# Patient Record
Sex: Male | Born: 1985 | Marital: Married | Smoking: Never smoker
Health system: Southern US, Community
[De-identification: ages and names within clinical notes are randomized; demographics above are authoritative.]

---

## 2014-02-08 ENCOUNTER — Ambulatory Visit (INDEPENDENT_AMBULATORY_CARE_PROVIDER_SITE_OTHER): Payer: BC Managed Care – PPO | Admitting: Sports Medicine

## 2014-02-08 ENCOUNTER — Encounter: Payer: Self-pay | Admitting: Sports Medicine

## 2014-02-08 VITALS — BP 104/67 | HR 69 | Ht 70.0 in | Wt 149.0 lb

## 2014-02-08 DIAGNOSIS — M940 Chondrocostal junction syndrome [Tietze]: Secondary | ICD-10-CM

## 2014-02-08 DIAGNOSIS — R0789 Other chest pain: Secondary | ICD-10-CM | POA: Insufficient documentation

## 2014-02-08 DIAGNOSIS — Z0000 Encounter for general adult medical examination without abnormal findings: Secondary | ICD-10-CM

## 2014-02-08 NOTE — Patient Instructions (Signed)
Costochondritis Costochondritis, sometimes called Tietze syndrome, is a swelling and irritation (inflammation) of the tissue (cartilage) that connects your ribs with your breastbone (sternum). It causes pain in the chest and rib area. Costochondritis usually goes away on its own over time. It can take up to 6 weeks or longer to get better, especially if you are unable to limit your activities. CAUSES  Some cases of costochondritis have no known cause. Possible causes include:  Injury (trauma).  Exercise or activity such as lifting.  Severe coughing. SIGNS AND SYMPTOMS  Pain and tenderness in the chest and rib area.  Pain that gets worse when coughing or taking deep breaths.  Pain that gets worse with specific movements. DIAGNOSIS  Your health care provider will do a physical exam and ask about your symptoms. Chest X-rays or other tests may be done to rule out other problems. TREATMENT  Costochondritis usually goes away on its own over time. Your health care provider may prescribe medicine to help relieve pain. HOME CARE INSTRUCTIONS   Avoid exhausting physical activity. Try not to strain your ribs during normal activity. This would include any activities using chest, abdominal, and side muscles, especially if heavy weights are used.  Apply ice to the affected area for the first 2 days after the pain begins.  Put ice in a plastic bag.  Place a towel between your skin and the bag.  Leave the ice on for 20 minutes, 2-3 times a day.  Only take over-the-counter or prescription medicines as directed by your health care provider. SEEK MEDICAL CARE IF:  You have redness or swelling at the rib joints. These are signs of infection.  Your pain does not go away despite rest or medicine. SEEK IMMEDIATE MEDICAL CARE IF:   Your pain increases or you are very uncomfortable.  You have shortness of breath or difficulty breathing.  You cough up blood.  You have worse chest pains,  sweating, or vomiting.  You have a fever or persistent symptoms for more than 2-3 days.  You have a fever and your symptoms suddenly get worse. MAKE SURE YOU:   Understand these instructions.  Will watch your condition.  Will get help right away if you are not doing well or get worse. Document Released: 03/19/2005 Document Revised: 03/30/2013 Document Reviewed: 01/11/2013 [employer] Patient Information 2015 [employer]. This information is not intended to replace advice given to you by your health care provider. Make sure you discuss any questions you have with your health care provider.  

## 2014-02-08 NOTE — Assessment & Plan Note (Signed)
Routine blood work. 

## 2014-02-08 NOTE — Progress Notes (Signed)
  Subjective:    CC: Establish care.   HPI:  This is a very pleasant 28 year old male, he is healthy, unfortunately after doing some rows in the gym he had immediate pain and a popping sensation in his left parasternal region. Since then he has not had any shortness of breath and symptoms have been progressively improving. Pain is mild, improving, no radiation, no lower extremity swelling. Pain is nonexertional and non-pleuritic.  Past medical history, Surgical history, Family history not pertinant except as noted below, Social history, Allergies, and medications have been entered into the medical record, reviewed, and no changes needed.   Review of Systems: No headache, visual changes, nausea, vomiting, diarrhea, constipation, dizziness, abdominal pain, skin rash, fevers, chills, night sweats, swollen lymph nodes, weight loss, chest pain, body aches, joint swelling, muscle aches, shortness of breath, mood changes, visual or auditory hallucinations.  Objective:    General: Well Developed, well nourished, and in no acute distress.  Neuro: Alert and oriented x3, extra-ocular muscles intact, sensation grossly intact.  HEENT: Normocephalic, atraumatic, pupils equal round reactive to light, neck supple, no masses, no lymphadenopathy, thyroid nonpalpable.  Skin: Warm and dry, no rashes noted.  Cardiac: Regular rate and rhythm, no murmurs rubs or gallops.  Respiratory: Clear to auscultation bilaterally. Not using accessory muscles, speaking in full sentences. No tenderness to palpation or deformity or other abnormality noted on palpation of the entire anterior chest wall. Abdominal: Soft, nontender, nondistended, positive bowel sounds, no masses, no organomegaly.  Musculoskeletal: Shoulder, elbow, wrist, hip, knee, ankle stable, and with full range of motion.  Impression and Recommendations:    The patient was counselled, risk factors were discussed, anticipatory guidance given.

## 2014-02-08 NOTE — Assessment & Plan Note (Signed)
Starting Mobic. Return in a month.

## 2014-02-28 ENCOUNTER — Ambulatory Visit (INDEPENDENT_AMBULATORY_CARE_PROVIDER_SITE_OTHER): Payer: BC Managed Care – PPO | Admitting: Sports Medicine

## 2014-02-28 ENCOUNTER — Telehealth: Payer: Self-pay | Admitting: *Deleted

## 2014-02-28 ENCOUNTER — Ambulatory Visit (INDEPENDENT_AMBULATORY_CARE_PROVIDER_SITE_OTHER): Payer: BC Managed Care – PPO

## 2014-02-28 DIAGNOSIS — M940 Chondrocostal junction syndrome [Tietze]: Secondary | ICD-10-CM | POA: Diagnosis not present

## 2014-02-28 DIAGNOSIS — R079 Chest pain, unspecified: Secondary | ICD-10-CM

## 2014-02-28 NOTE — Telephone Encounter (Signed)
No prior auth required for CT chest w/o due to his [place]. Spoke with [facility]. Margette Fast, CMA

## 2014-02-28 NOTE — Assessment & Plan Note (Addendum)
Persistent symptoms with an occasional pop in the anterior chest associated with mild transient shortness of breath when lifting and doing overhead activities. Chest x-ray and CT scan of the chest without IV contrast. 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

## 2014-02-28 NOTE — Progress Notes (Signed)
  Subjective:    CC: Followup chest injury  HPI: This is a very pleasant 28 year old male, he was in the gym, doing overhead press and felt a pop in his anterior chest with subsequent mild pain. Exam at the last visit 3 weeks ago was essentially benign, so we treated him conservatively with Mobic, unfortunately he continues to have a popping sensation in the anterior chest in the midline beneath the sternum with overhead press. Initially he had an episode of shortness of breath but since then it is simply been a non-painful pop. Mild, persistent.  Past medical history, Surgical history, Family history not pertinant except as noted below, Social history, Allergies, and medications have been entered into the medical record, reviewed, and no changes needed.   Review of Systems: No fevers, chills, night sweats, weight loss, chest pain, or shortness of breath.   Objective:    General: Well Developed, well nourished, and in no acute distress.  Neuro: Alert and oriented x3, extra-ocular muscles intact, sensation grossly intact.  HEENT: Normocephalic, atraumatic, pupils equal round reactive to light, neck supple, no masses, no lymphadenopathy, thyroid nonpalpable.  Skin: Warm and dry, no rashes. Cardiac: Regular rate and rhythm, no murmurs rubs or gallops, no lower extremity edema.  Respiratory: Clear to auscultation bilaterally. Not using accessory muscles, speaking in full sentences. There is no tenderness to palpation at any part of the chest, sternum, manubrium, ribs, costal cartilage.  Impression and Recommendations:

## 2014-04-06 ENCOUNTER — Ambulatory Visit (INDEPENDENT_AMBULATORY_CARE_PROVIDER_SITE_OTHER): Payer: BC Managed Care – PPO

## 2014-04-06 ENCOUNTER — Ambulatory Visit (INDEPENDENT_AMBULATORY_CARE_PROVIDER_SITE_OTHER): Payer: BC Managed Care – PPO | Admitting: Sports Medicine

## 2014-04-06 ENCOUNTER — Encounter: Payer: Self-pay | Admitting: Sports Medicine

## 2014-04-06 ENCOUNTER — Telehealth: Payer: Self-pay | Admitting: *Deleted

## 2014-04-06 VITALS — BP 110/66 | HR 76 | Ht 70.0 in | Wt 152.0 lb

## 2014-04-06 DIAGNOSIS — K921 Melena: Secondary | ICD-10-CM | POA: Diagnosis not present

## 2014-04-06 DIAGNOSIS — D1809 Hemangioma of other sites: Secondary | ICD-10-CM

## 2014-04-06 LAB — HEMOCCULT GUIAC POC 1CARD (OFFICE): Fecal Occult Blood, POC: NEGATIVE

## 2014-04-06 MED ORDER — SENNOSIDES-DOCUSATE SODIUM 8.6-50 MG PO TABS: 2.0000 | ORAL_TABLET | Freq: Two times a day (BID) | ORAL | Status: DC

## 2014-04-06 MED ORDER — IOHEXOL 300 MG/ML  SOLN
100.0000 mL | Freq: Once | INTRAMUSCULAR | Status: AC | PRN
  Administered 2014-04-06: 100 mL via INTRAVENOUS

## 2014-04-06 MED ORDER — POLYETHYLENE GLYCOL 3350 17 G PO PACK: 17.0000 g | PACK | Freq: Two times a day (BID) | ORAL | Status: DC

## 2014-04-06 MED ORDER — CIPROFLOXACIN HCL 750 MG PO TABS: 750.0000 mg | ORAL_TABLET | Freq: Two times a day (BID) | ORAL | Status: DC

## 2014-04-06 MED ORDER — NITROGLYCERIN 0.4 % RE OINT: 1.0000 [in_us] | TOPICAL_OINTMENT | Freq: Two times a day (BID) | RECTAL | Status: DC | PRN

## 2014-04-06 MED ORDER — METRONIDAZOLE 500 MG PO TABS: 500.0000 mg | ORAL_TABLET | Freq: Two times a day (BID) | ORAL | Status: AC

## 2014-04-06 NOTE — Telephone Encounter (Signed)
[employer] [place] - no prior auth required for Ct abd/pelvis. Margette Fast, CMA

## 2014-04-06 NOTE — Assessment & Plan Note (Addendum)
Hemoccult was negative. Anoscopy did show fecal impaction, and anal fissure and an external hemorrhoid. There is also partial drainage of pus pocket at the 12:00 position. CT of the abdomen and pelvis with IV contrast, Cipro, Flagyl, stool regimen. Nitroglycerin ointment. Return in one week to see how things are going.

## 2014-04-06 NOTE — Progress Notes (Signed)
  Subjective:    CC: Hematochezia  HPI: This is a very pleasant 28 year old male, for the past 2 weeks she's had increasing abdominal pain, with nausea, vomiting, occasional diarrhea followed by fecal impaction. Subsequently he noted bright red blood in his stool with significant pain on stooling. No fevers, chills, night sweats, weight loss. Symptoms are mild, persistent.  Past medical history, Surgical history, Family history not pertinant except as noted below, Social history, Allergies, and medications have been entered into the medical record, reviewed, and no changes needed.   Review of Systems: No fevers, chills, night sweats, weight loss, chest pain, or shortness of breath.   Objective:    General: Well Developed, well nourished, and in no acute distress.  Neuro: Alert and oriented x3, extra-ocular muscles intact, sensation grossly intact.  HEENT: Normocephalic, atraumatic, pupils equal round reactive to light, neck supple, no masses, no lymphadenopathy, thyroid nonpalpable.  Skin: Warm and dry, no rashes. Cardiac: Regular rate and rhythm, no murmurs rubs or gallops, no lower extremity edema.  Respiratory: Clear to auscultation bilaterally. Not using accessory muscles, speaking in full sentences. Abdomen: Soft, nontender, nondistended, normal bowel sounds, no palpable masses. Anoscopy: There is a visible fecal impaction, there is also Anal fissures as well as a perirectal abscess with leaking pus. He also had a small, nonthrombosed external hemorrhoid.  Hemoccult negative.  Impression and Recommendations:

## 2014-04-06 NOTE — Patient Instructions (Signed)
Peri-Rectal Abscess Your caregiver has diagnosed you as having a peri-rectal abscess. This is an infected area near the rectum that is filled with pus. If the abscess is near the surface of the skin, your caregiver may open (incise) the area and drain the pus. HOME CARE INSTRUCTIONS   If your abscess was opened up and drained. A small piece of gauze may be placed in the opening so that it can drain. Do not remove the gauze unless directed by your caregiver.  A loose dressing may be placed over the abscess site. Change the dressing as often as necessary to keep it clean and dry.  After the drain is removed, the area may be washed with a gentle antiseptic (soap) four times per day.  A warm sitz bath, warm packs or heating pad may be used for pain relief, taking care not to burn yourself.  Return for a wound check in 1 day or as directed.  An "inflatable doughnut" may be used for sitting with added comfort. These can be purchased at a drugstore or medical supply house.  To reduce pain and straining with bowel movements, eat a high fiber diet with plenty of fruits and vegetables. Use stool softeners as recommended by your caregiver. This is especially important if narcotic type pain medications were prescribed as these may cause marked constipation.  Only take over-the-counter or prescription medicines for pain, discomfort, or fever as directed by your caregiver. SEEK IMMEDIATE MEDICAL CARE IF:   You have increasing pain that is not controlled by medication.  There is increased inflammation (redness), swelling, bleeding, or drainage from the area.  An oral temperature above 102 F (38.9 C) develops.  You develop chills or generalized malaise (feel lethargic or feel "washed out").  You develop any new symptoms (problems) you feel may be related to your present problem. Document Released: 06/06/2000 Document Revised: 09/01/2011 Document Reviewed: 06/06/2008 [facility] Patient Information  2015 [place], [place]. This information is not intended to replace advice given to you by your health care provider. Make sure you discuss any questions you have with your health care provider.   Anal Fissure, Adult An anal fissure is a small tear or crack in the skin around the anus. Bleeding from a fissure usually stops on its own within a few minutes. However, bleeding will often reoccur with each bowel movement until the crack heals.  CAUSES   Passing large, hard stools.  Frequent diarrheal stools.  Constipation.  Inflammatory bowel disease (Crohn's disease or ulcerative colitis).  Infections.  Anal sex. SYMPTOMS   Small amounts of blood seen on your stools, on toilet paper, or in the toilet after a bowel movement.  Rectal bleeding.  Painful bowel movements.  Itching or irritation around the anus. DIAGNOSIS Your caregiver will examine the anal area. An anal fissure can usually be seen with careful inspection. A rectal exam may be performed and a short tube (anoscope) may be used to examine the anal canal. TREATMENT   You may be instructed to take fiber supplements. These supplements can soften your stool to help make bowel movements easier.  Sitz baths may be recommended to help heal the tear. Do not use soap in the sitz baths.  A medicated cream or ointment may be prescribed to lessen discomfort. HOME CARE INSTRUCTIONS   Maintain a diet high in fruits, whole grains, and vegetables. Avoid constipating foods like bananas and dairy products.  Take sitz baths as directed by your caregiver.  Drink enough fluids to  keep your urine clear or pale yellow.  Only take over-the-counter or prescription medicines for pain, discomfort, or fever as directed by your caregiver. Do not take aspirin as this may increase bleeding.  Do not use ointments containing numbing medications (anesthetics) or hydrocortisone. They could slow healing. SEEK MEDICAL CARE IF:   Your fissure is not  completely healed within 3 days.  You have further bleeding.  You have a fever.  You have diarrhea mixed with blood.  You have pain.  Your problem is getting worse rather than better. MAKE SURE YOU:   Understand these instructions.  Will watch your condition.  Will get help right away if you are not doing well or get worse. Document Released: 06/09/2005 Document Revised: 09/01/2011 Document Reviewed: 11/24/2010 [facility] Patient Information 2015 [place], [place]. This information is not intended to replace advice given to you by your health care provider. Make sure you discuss any questions you have with your health care provider.

## 2014-05-04 ENCOUNTER — Encounter: Payer: Self-pay | Admitting: Sports Medicine

## 2014-05-04 ENCOUNTER — Ambulatory Visit (INDEPENDENT_AMBULATORY_CARE_PROVIDER_SITE_OTHER): Payer: BC Managed Care – PPO | Admitting: Sports Medicine

## 2014-05-04 VITALS — BP 116/71 | HR 79 | Ht 70.0 in | Wt 155.0 lb

## 2014-05-04 DIAGNOSIS — Z0000 Encounter for general adult medical examination without abnormal findings: Secondary | ICD-10-CM

## 2014-05-04 DIAGNOSIS — K921 Melena: Secondary | ICD-10-CM | POA: Diagnosis not present

## 2014-05-04 NOTE — Assessment & Plan Note (Signed)
Declines tetanus vaccination, climbs influenza vaccination.

## 2014-05-04 NOTE — Progress Notes (Signed)
  Subjective:    CC: follow-up  HPI: Anal abscess/ hematochezia: Resolved with stool regimen, antibiotics. CT scan was negative. Overall feeling significantly better. Wonders if he can start exercising again.  Preventative measures: Has not yet gotten his routine blood work, declines influenza and tetanus vaccination.  Past medical history, Surgical history, Family history not pertinant except as noted below, Social history, Allergies, and medications have been entered into the medical record, reviewed, and no changes needed.   Review of Systems: No fevers, chills, night sweats, weight loss, chest pain, or shortness of breath.   Objective:    General: Well Developed, well nourished, and in no acute distress.  Neuro: Alert and oriented x3, extra-ocular muscles intact, sensation grossly intact.  HEENT: Normocephalic, atraumatic, pupils equal round reactive to light, neck supple, no masses, no lymphadenopathy, thyroid nonpalpable.  Skin: Warm and dry, no rashes. Cardiac: Regular rate and rhythm, no murmurs rubs or gallops, no lower extremity edema.  Respiratory: Clear to auscultation bilaterally. Not using accessory muscles, speaking in full sentences.  Impression and Recommendations:

## 2014-05-04 NOTE — Assessment & Plan Note (Signed)
Resolved with conservative measures. Increase fiber in diet and hydration. Return as needed for this.

## 2014-05-12 ENCOUNTER — Encounter: Payer: Self-pay | Admitting: Family Medicine

## 2014-05-12 ENCOUNTER — Ambulatory Visit (INDEPENDENT_AMBULATORY_CARE_PROVIDER_SITE_OTHER): Payer: BC Managed Care – PPO | Admitting: Family Medicine

## 2014-05-12 VITALS — BP 107/68 | HR 74 | Temp 98.7°F | Wt 153.0 lb

## 2014-05-12 DIAGNOSIS — K602 Anal fissure, unspecified: Secondary | ICD-10-CM | POA: Diagnosis not present

## 2014-05-12 LAB — POCT HEMOGLOBIN: HEMOGLOBIN: 15.3 g/dL (ref 14.1–18.1)

## 2014-05-12 MED ORDER — NITROGLYCERIN 0.4 % RE OINT
TOPICAL_OINTMENT | RECTAL | Status: DC
Start: 2014-05-12 — End: 2014-06-19

## 2014-05-12 NOTE — Progress Notes (Signed)
CC: Kyle Clements is a 28 y.o. male is here for Blood In Stools   Subjective: HPI:  Patient complains of bright red blood on his stool and when wiping his rectum that occurred last night during a bowel movement. He states that this bowel movement also was accompanied by a sudden sharp pain in the rectum. Last month he was found to have a external hemorrhoid, anal fissure and abscess in the rectum. Symptoms improved after taking Cipro and Flagyl, he continues to also take what sounds to be a heaping tablespoon of psyllium powder on a daily basis. No other interventions. Bleeding stopped late last month and now movements have been well formed, painless and without any blood or melena appearance up until last night. He denies any rectal pain with sitting, only with defecation. He denies any abdominal pain, nausea, vomiting, fevers, chills, flank pain, genitourinary complaints.   Review Of Systems Outlined In HPI  No past medical history on file.  No past surgical history on file. No family history on file.  History   Social History  . Marital Status: Married    Spouse Name: N/A    Number of Children: N/A  . Years of Education: N/A   Occupational History  . Not on file.   Social History Main Topics  . Smoking status: Never Smoker   . Smokeless tobacco: Not on file  . Alcohol Use: Not on file  . Drug Use: Not on file  . Sexual Activity: Not on file   Other Topics Concern  . Not on file   Social History Narrative     Objective: BP 107/68 mmHg  Pulse 74  Temp(Src) 98.7 F (37.1 C) (Oral)  Wt 153 lb (69.4 kg)  General: Alert and Oriented, No Acute Distress HEENT: Pupils equal, round, reactive to light. Conjunctivae clear.  Moist mucous membranespharynxunremarkable Lungs:clear comfortable work of breathing Cardiac: Regular rate and rhythm.  Rectal: Appropriate rectal tone, No external hemorrhoid but he does have some scarring at the superiormost position from what looks like  was a prior hemorrhoid. No blood around the rectum. He has a tender rectal fissure at the superior most aspect of therectum with no active bleeding Extremities: No peripheral edema.  Strong peripheral pulses.  Mental Status: No depression, anxiety, nor agitation. Skin: Warm and dry.  Assessment & Plan: Kyle Clements was seen today for blood in stools.  Diagnoses and associated orders for this visit:  Anal fissure - Nitroglycerin 0.4 % OINT; One inch applied to the rectum twice a day. - Ambulatory referral to Gastroenterology - POCT hemoglobin    Anal fissure: Reassurance was provided that I do not feel that he has a recurrence of his abscess given way he describes the pain in the appearance of a rectal fissure today. History over the past 0.4 hours is most consistent with a rectal fissure. Here he has nitroglycerin ointment at home but I provided him with a additional prescription should he not be able to find the nitroglycerin. He never used this when prescribed by his PCP last month.discussedhe will likely have a little bit of bleeding for the next 2 or 3 days but should improve by next week. If no benefit after 1 week then go through with GI referral was placed today. Continue fiber supplementation.  Signs and symptoms requring emergent/urgent reevaluation were discussed with the patient.  25 minutes spent face-to-face during visit today of which at least 50% was counseling or coordinating care regarding: 1. Anal fissure  Return if symptoms worsen or fail to improve.

## 2014-06-19 ENCOUNTER — Ambulatory Visit (INDEPENDENT_AMBULATORY_CARE_PROVIDER_SITE_OTHER): Payer: BC Managed Care – PPO | Admitting: Family Medicine

## 2014-06-19 ENCOUNTER — Encounter: Payer: Self-pay | Admitting: Family Medicine

## 2014-06-19 VITALS — BP 117/71 | HR 85 | Temp 98.6°F | Wt 147.0 lb

## 2014-06-19 DIAGNOSIS — A499 Bacterial infection, unspecified: Secondary | ICD-10-CM | POA: Diagnosis not present

## 2014-06-19 DIAGNOSIS — J329 Chronic sinusitis, unspecified: Secondary | ICD-10-CM

## 2014-06-19 DIAGNOSIS — B9689 Other specified bacterial agents as the cause of diseases classified elsewhere: Secondary | ICD-10-CM

## 2014-06-19 MED ORDER — AMOXICILLIN-POT CLAVULANATE 500-125 MG PO TABS: ORAL_TABLET | ORAL | Status: AC

## 2014-06-19 NOTE — Progress Notes (Signed)
CC: Kyle Clements is a 28 y.o. male is here for Sinusitis   Subjective: HPI:  Facial pressure localized to the forehead and the cheeks, the latter of the 2 radiates into his upper molars. Symptoms present for 9-10 days and were slightly getting better in the middle of last week however got significantly worse over the weekend, quickly. Overall symptoms are moderate in severity. Covered by nonproductive cough with nasal congestion and subjective postnasal drip. He's had fatigue but denies any fevers, chills, night sweats. Denies any new motor or sensory disturbances. He's tried over-the-counter cough and cold medicine without much benefit however ibuprofen has helped relieve some of the discomfort temporarily. Denies wheezing, shortness of breath, nor blood and sputum   Review Of Systems Outlined In HPI  No past medical history on file.  No past surgical history on file. No family history on file.  History   Social History  . Marital Status: Married    Spouse Name: N/A    Number of Children: N/A  . Years of Education: N/A   Occupational History  . Not on file.   Social History Main Topics  . Smoking status: Never Smoker   . Smokeless tobacco: Not on file  . Alcohol Use: Not on file  . Drug Use: Not on file  . Sexual Activity: Not on file   Other Topics Concern  . Not on file   Social History Narrative     Objective: BP 117/71 mmHg  Pulse 85  Temp(Src) 98.6 F (37 C) (Oral)  Wt 147 lb (66.679 kg)  General: Alert and Oriented, No Acute Distress HEENT: Pupils equal, round, reactive to light. Conjunctivae clear.  External ears unremarkable, canals clear with intact TMs with appropriate landmarks.  Middle ear appears open without effusion. Pink inferior turbinates with moderate mucoid discharge.  Moist mucous membranes, pharynx without inflammation nor lesions.  Neck supple without palpable lymphadenopathy nor abnormal masses. Lungs: Clear to auscultation bilaterally, no  wheezing/ronchi/rales.  Comfortable work of breathing. Good air movement. Cardiac: Regular rate and rhythm. Normal S1/S2.  No murmurs, rubs, nor gallops.   Mental Status: No depression, anxiety, nor agitation. Skin: Warm and dry.  Assessment & Plan: Kyle Clements was seen today for sinusitis.  Diagnoses and associated orders for this visit:  Bacterial sinusitis - amoxicillin-clavulanate (AUGMENTIN) 500-125 MG per tablet; Take one by mouth every 8 hours for ten total days.    Bacterial sinusitis: Start Augmentin consider nasal saline washes and Alka-Seltzer cold and sinus.   Return if symptoms worsen or fail to improve.

## 2014-07-13 ENCOUNTER — Ambulatory Visit (INDEPENDENT_AMBULATORY_CARE_PROVIDER_SITE_OTHER): Payer: BLUE CROSS/BLUE SHIELD | Admitting: Family Medicine

## 2014-07-13 ENCOUNTER — Encounter: Payer: Self-pay | Admitting: Family Medicine

## 2014-07-13 VITALS — BP 122/76 | HR 75 | Temp 98.3°F | Wt 156.0 lb

## 2014-07-13 DIAGNOSIS — K047 Periapical abscess without sinus: Secondary | ICD-10-CM | POA: Diagnosis not present

## 2014-07-13 MED ORDER — CLINDAMYCIN HCL 150 MG PO CAPS: 450.0000 mg | ORAL_CAPSULE | Freq: Three times a day (TID) | ORAL | Status: DC

## 2014-07-13 MED ORDER — TRAMADOL HCL 50 MG PO TABS: 50.0000 mg | ORAL_TABLET | Freq: Three times a day (TID) | ORAL | Status: DC | PRN

## 2014-07-13 NOTE — Progress Notes (Signed)
CC: Kyle Clements is a 29 y.o. male is here for gum infection ?   Subjective: HPI:  Right inferior posterior gumline pain present for the past 2-3 days worsening on a daily basis. Temporarily improved with salt water gargles. Accompanied by swelling underneath the right jaw that came on this morning. Pain is worse with swallowing or chewing. He's also noticed a ulceration near the site of his discomfort that is mildly tender to the touch. No other interventions as of yet. Nothing else seems to be better or worse. Pain is persistent throughout the day but he is able to sleep at night. Denies fevers, chills, dysphagia, shortness of breath, wheezing nor difficult swallowing.   Review Of Systems Outlined In HPI  No past medical history on file.  No past surgical history on file. No family history on file.  History   Social History  . Marital Status: Married    Spouse Name: N/A    Number of Children: N/A  . Years of Education: N/A   Occupational History  . Not on file.   Social History Main Topics  . Smoking status: Never Smoker   . Smokeless tobacco: Not on file  . Alcohol Use: Not on file  . Drug Use: Not on file  . Sexual Activity: Not on file   Other Topics Concern  . Not on file   Social History Narrative     Objective: BP 122/76 mmHg  Pulse 75  Temp(Src) 98.3 F (36.8 C) (Oral)  Wt 156 lb (70.761 kg)  General: Alert and Oriented, No Acute Distress HEENT: Pupils equal, round, reactive to light. Conjunctivae clear.   Moist mucous membranes, posterior pharynx without inflammation nor lesions. Halfway ruptured right inferior posterior wisdom tooth with surrounding gingival erythema tender to the touch with a shallow ulceration just posterior to this. No tenderness with palpation of the right parotid gland. Single tender lymph node underneath the angle of the mandible, no other palpable masses in the neck Lungs: Clear to auscultation bilaterally, no wheezing/ronchi/rales.   Comfortable work of breathing. Good air movement. Cardiac: Regular rate and rhythm. Normal S1/S2.  No murmurs, rubs, nor gallops.   Mental Status: No depression, anxiety, nor agitation. Skin: Warm and dry.  Assessment & Plan: Kyle Clements was seen today for gum infection ?Marland Kitchen  Diagnoses and associated orders for this visit:  Dental infection - clindamycin (CLEOCIN) 150 MG capsule; Take 3 capsules (450 mg total) by mouth 3 (three) times daily. Decrease to 2 capsules three times a day if any side effects.  Other Orders - traMADol (ULTRAM) 50 MG tablet; Take 1 tablet (50 mg total) by mouth every 8 (eight) hours as needed.    Dental infection: He was just on Augmentin within the last month therefore start clindamycin. Begin ibuprofen 800 mg 3 times a day and if not controlling pain may fill tramadol. Encouraged to see his dentist in the near future to discuss wisdom tooth removal.  Return if symptoms worsen or fail to improve.

## 2014-07-17 ENCOUNTER — Telehealth: Payer: Self-pay

## 2014-07-17 DIAGNOSIS — R197 Diarrhea, unspecified: Secondary | ICD-10-CM

## 2014-07-17 NOTE — Telephone Encounter (Signed)
Seth Bake, Will you please relay my recommendation to continue clindamycin but to provide a stool sample to check for c. Diff.  Lab slip in your inbox.  (My suspicion for c.diff is low, stomach cramping and diarrhea are common side effects of clindamycin.)

## 2014-07-17 NOTE — Telephone Encounter (Signed)
Patient's wife called and states he has had stomach cramps, diarrhea, gas and flu like symptoms since Friday. When I asked what type of flu like symptoms she reports a runny nose. She states her husband was told if he has these symptoms to call back because he may have C-diff. Please advise.

## 2014-07-17 NOTE — Telephone Encounter (Signed)
Pt's spouse notified.

## 2014-07-24 ENCOUNTER — Other Ambulatory Visit: Payer: Self-pay | Admitting: Family Medicine

## 2014-07-25 ENCOUNTER — Ambulatory Visit (INDEPENDENT_AMBULATORY_CARE_PROVIDER_SITE_OTHER): Payer: BLUE CROSS/BLUE SHIELD | Admitting: Family Medicine

## 2014-07-25 ENCOUNTER — Encounter: Payer: Self-pay | Admitting: Family Medicine

## 2014-07-25 VITALS — BP 115/77 | HR 70 | Wt 152.0 lb

## 2014-07-25 DIAGNOSIS — R197 Diarrhea, unspecified: Secondary | ICD-10-CM

## 2014-07-25 LAB — CBC
HEMATOCRIT: 47.4 % (ref 39.0–52.0)
Hemoglobin: 15.4 g/dL (ref 13.0–17.0)
MCH: 27 pg (ref 26.0–34.0)
MCHC: 32.4 g/dL (ref 30.0–36.0)
MCV: 83 fL (ref 78.0–100.0)
Platelets: 309 10*3/uL (ref 150–400)
RBC: 5.71 MIL/uL (ref 4.22–5.81)
RDW: 13.6 % (ref 11.5–15.5)
WBC: 7.9 10*3/uL (ref 4.0–10.5)

## 2014-07-25 LAB — COMPLETE METABOLIC PANEL WITH GFR
ALBUMIN: 4.9 g/dL (ref 3.5–5.2)
ALT: 39 U/L (ref 0–53)
AST: 22 U/L (ref 0–37)
Alkaline Phosphatase: 106 U/L (ref 39–117)
BUN: 16 mg/dL (ref 6–23)
CALCIUM: 10 mg/dL (ref 8.4–10.5)
CO2: 26 meq/L (ref 19–32)
Chloride: 98 mEq/L (ref 96–112)
Creat: 0.73 mg/dL (ref 0.50–1.35)
GFR, Est African American: >89 mL/min
GLUCOSE: 80 mg/dL (ref 70–99)
Potassium: 4.7 mEq/L (ref 3.5–5.3)
SODIUM: 138 meq/L (ref 135–145)
Total Bilirubin: 0.4 mg/dL (ref 0.2–1.2)
Total Protein: 8.1 g/dL (ref 6.0–8.3)

## 2014-07-25 LAB — C. DIFFICILE GDH AND TOXIN A/B
C. DIFF TOXIN A/B: NOT DETECTED
C. difficile GDH: NOT DETECTED

## 2014-07-25 NOTE — Progress Notes (Signed)
CC: Kyle Clements is a 29 y.o. male is here for Abdominal Pain and Headache   Subjective: HPI:  Complains of abdominal discomfort and diarrhea that has been present ever since he started on clindamycin when I saw him 2 weeks ago. He describes the discomfort as a numbness in the right upper and lower quadrant that comes and goes without warning throughout the day mild to moderate in severity. It is also described as a pulling sensation.  It radiates into the back near the right upper quadrant. Nothing particular seems to make it better or worse. He's also had diarrhea on average 4-5 bowel movements a day described as both loose and occasionally formed. He tells me that he is having a bowel movement after every meal and it looks like the undigested meal he had just prior to that meal. Symptoms are slightly improving with taking probiotics however over the weekend on Saturday began to worsen. He tells me he feels hung over, further elaborating that this is a sensation of hungry but lack of energy. He believes that he has had a black appearance to his stool and his abdominal discomfort has woken him once over the weekend from sleep. Review of systems positive for fevers and chills with nausea.  Denies hematuria, bright red blood in stool, nor blood in urine.  His spouse notified our office of some gastrointestinal complaints on the 25th after he started clindamycin. On the 25th he was asked him to provide me with a stool sample to check for C. Difficile. They submitted this yesterday, they've done a lot of research about C. Difficile and have many well-thought-out questions.  He's not sure whether his dental abscess has healed. He's not having any pain in the right jaw anymore.   Review Of Systems Outlined In HPI  No past medical history on file.  No past surgical history on file. No family history on file.  History   Social History  . Marital Status: Married    Spouse Name: N/A    Number of  Children: N/A  . Years of Education: N/A   Occupational History  . Not on file.   Social History Main Topics  . Smoking status: Never Smoker   . Smokeless tobacco: Not on file  . Alcohol Use: Not on file  . Drug Use: Not on file  . Sexual Activity: Not on file   Other Topics Concern  . Not on file   Social History Narrative     Objective: BP 115/77 mmHg  Pulse 70  Wt 152 lb (68.947 kg)  General: Alert and Oriented, No Acute Distress HEENT: Pupils equal, round, reactive to light. Conjunctivae clear.  Marcie December and pharynx unremarkable. The crater/ulceration that was just posterior to the inferior posterior most right molar is no longer present. Lungs: clear and comfortable work of breathing Cardiac: Regular rate and rhythm.  Abdomen: Normal bowel sounds, soft and non tender without palpable masses. No guarding rigidity nor rebound. Extremities: No peripheral edema.  Strong peripheral pulses.  Mental Status:no depression or agitation. Moderately anxious. Skin: Warm and dry.  Assessment & Plan: Kyle Clements was seen today for abdominal pain and headache.  Diagnoses and associated orders for this visit:  Diarrhea - Cancel: COMPLETE METABOLIC PANEL WITH GFR - Cancel: CBC - COMPLETE METABOLIC PANEL WITH GFR - CBC    Diarrhea: Time was taken to answer all questions about how clindamycin could cause C. Difficile, typical symptoms of C. Difficile, treatment strategies of C. Difficile, and  testing strategies for C. Difficile. Discussed that the most likely explanation of his diarrhea fatigue and abdominal discomfort is due to clindamycin change in the bacterial flora of his gut and that this can be treated with probiotics, yogurt, the brat diet provided C. Difficile test is negative. He and his wife are worried that there could be something more serious going on affecting his vital organs, discuss my low suspicion of this however they would like blood work to be done to confirm  this.  40 minutes spent face-to-face during visit today of which at least 50% was counseling or coordinating care regarding: 1. Diarrhea       Return if symptoms worsen or fail to improve.

## 2014-07-26 ENCOUNTER — Telehealth: Payer: Self-pay | Admitting: Family Medicine

## 2014-07-26 MED ORDER — DIPHENOXYLATE-ATROPINE 2.5-0.025 MG PO TABS: 1.0000 | ORAL_TABLET | Freq: Four times a day (QID) | ORAL | Status: DC | PRN

## 2014-07-26 NOTE — Telephone Encounter (Signed)
Kyle Clements, Will you please let patient know that his c.diff test was negative.  This is reassuring and suggests that his loose stools and abdominal discomfort is due to a shift in the normal bacteria in his bowels.  This can be improved with taking a daily probiotic, sticking to the BRAT diet for the next two days, and taking as needed Lomotil that i've printed off.  If symptoms persist after this weekend I'd recommend he follow up with Dr. Darene Lamer his PCP. (Can you please relay to the front staff that if he requests follow up that it should be with Dr. Darene Lamer)

## 2014-07-26 NOTE — Telephone Encounter (Signed)
Pt spouse notified. She states he prob will not want to take medication but I did fax over the rx to pharm

## 2014-08-08 ENCOUNTER — Ambulatory Visit: Payer: BLUE CROSS/BLUE SHIELD | Admitting: Sports Medicine

## 2014-08-16 ENCOUNTER — Ambulatory Visit (INDEPENDENT_AMBULATORY_CARE_PROVIDER_SITE_OTHER): Payer: BLUE CROSS/BLUE SHIELD | Admitting: Sports Medicine

## 2014-08-16 ENCOUNTER — Ambulatory Visit: Payer: BLUE CROSS/BLUE SHIELD | Admitting: Family Medicine

## 2014-08-16 ENCOUNTER — Encounter: Payer: Self-pay | Admitting: Sports Medicine

## 2014-08-16 DIAGNOSIS — K921 Melena: Secondary | ICD-10-CM | POA: Diagnosis not present

## 2014-08-16 MED ORDER — HYOSCYAMINE SULFATE 0.125 MG PO TABS: 0.1250 mg | ORAL_TABLET | ORAL | Status: AC | PRN

## 2014-08-16 NOTE — Progress Notes (Signed)
  Subjective:    CC: Followup  HPI: Kyle Clements returns, for months now he has had GI issues, starting with a rectal abscess and fissure, this improved with conservative measures, subsequently he developed diarrhea after a course of clindamycin, c diff toxins were negative however he has had on and off abdominal pain with cramps, 3h after eating he usually has to stool.  Stools are well formed , and he denies any diarrhea. Symptoms are moderate, persistent. He feels the symptoms are somewhat worse with dairy products, vegetables, but okay with fruits. No constitutional symptoms.  Past medical history, Surgical history, Family history not pertinant except as noted below, Social history, Allergies, and medications have been entered into the medical record, reviewed, and no changes needed.   Review of Systems: No fevers, chills, night sweats, weight loss, chest pain, or shortness of breath.   Objective:    General: Well Developed, well nourished, and in no acute distress.  Neuro: Alert and oriented x3, extra-ocular muscles intact, sensation grossly intact.  HEENT: Normocephalic, atraumatic, pupils equal round reactive to light, neck supple, no masses, no lymphadenopathy, thyroid nonpalpable.  Skin: Warm and dry, no rashes. Cardiac: Regular rate and rhythm, no murmurs rubs or gallops, no lower extremity edema.  Respiratory: Clear to auscultation bilaterally. Not using accessory muscles, speaking in full sentences. Abdomen: Soft, nontender, nondistended, normal bowel sound no palpable masses.  Impression and Recommendations:

## 2014-08-16 NOTE — Assessment & Plan Note (Signed)
It has now been several months with recurrent GI issues including diarrhea, constipation, hematochezia, rectal fissures, hemorrhoids, and now it sounds like pyuria. At this point we are going to do a full workup for inflammatory bowel disease, stool cultures. Some of his symptoms are reminiscent of irritable bowel syndrome so I am going to give him some hyoscyamine for use in the meantime. I would also like him to see gastroenterologist, I do think he needs colonoscopy. CT of the abdomen and pelvis with IV and oral contrast was negative. Symptoms also seem to be worsened with vegetable intake, he will avoid this for now, but continue fiber supplementation.

## 2014-08-17 LAB — SEDIMENTATION RATE: Sed Rate: 1 mm/hr (ref 0–15)

## 2014-08-17 LAB — CBC WITH DIFFERENTIAL/PLATELET
Basophils Absolute: 0 10*3/uL (ref 0.0–0.1)
Basophils Relative: 0 % (ref 0–1)
Eosinophils Absolute: 0.1 K/uL (ref 0.0–0.7)
Eosinophils Relative: 2 % (ref 0–5)
HCT: 46.1 % (ref 39.0–52.0)
Hemoglobin: 14.9 g/dL (ref 13.0–17.0)
Lymphocytes Relative: 35 % (ref 12–46)
Lymphs Abs: 2.4 10*3/uL (ref 0.7–4.0)
MCH: 27.4 pg (ref 26.0–34.0)
MCHC: 32.3 g/dL (ref 30.0–36.0)
MCV: 84.9 fL (ref 78.0–100.0)
MPV: 9.9 fL (ref 8.6–12.4)
Monocytes Absolute: 0.5 10*3/uL (ref 0.1–1.0)
Monocytes Relative: 7 % (ref 3–12)
Neutro Abs: 3.8 10*3/uL (ref 1.7–7.7)
Neutrophils Relative %: 56 % (ref 43–77)
Platelets: 290 10*3/uL (ref 150–400)
RBC: 5.43 MIL/uL (ref 4.22–5.81)
RDW: 13.7 % (ref 11.5–15.5)
WBC: 6.8 10*3/uL (ref 4.0–10.5)

## 2014-08-17 LAB — TSH: TSH: 1.478 u[IU]/mL (ref 0.350–4.500)

## 2014-08-17 LAB — GLIA (IGA/G) + TTG IGA
Deamidated Gliadin Abs, IgG: 8 U (ref <20)
Gliadin IgA: 4 U (ref <20)
Tissue Transglutaminase Ab, IgA: 1 U/mL (ref <4)

## 2014-08-17 LAB — ENDOMYSIAL AB IGA RFLX TITER: Endomysial Screen: NEGATIVE

## 2014-08-17 LAB — ANCA SCREEN W REFLEX TITER
Atypical p-ANCA Screen: NEGATIVE
c-ANCA Screen: NEGATIVE
p-ANCA Screen: NEGATIVE

## 2014-08-17 LAB — LIPASE: Lipase: 25 U/L (ref 0–75)

## 2014-08-17 LAB — ALLERGEN MILK: Milk IgE: <0.1 kU/L

## 2014-08-17 LAB — TISSUE TRANSGLUTAMINASE, IGG: t-Transglutaminase (tTG) IgG: 4 U/mL (ref <6)

## 2014-08-18 LAB — C. DIFFICILE GDH AND TOXIN A/B
C. difficile GDH: NOT DETECTED
C. difficile Toxin A/B: NOT DETECTED

## 2014-08-18 LAB — GIARDIA ANTIGEN: Giardia Screen (EIA): NEGATIVE

## 2014-08-19 LAB — FECAL LACTOFERRIN, QUANT: Lactoferrin: NEGATIVE

## 2014-08-19 LAB — E. HISTOLYTICA ANTIBODY (AMOEBA AB)

## 2014-08-19 LAB — SACCHAROMYCES CEREVISIAE ANTIBODIES, IGG AND IGA
Saccharomyces cerevisiae IgG: 22.4 U — ABNORMAL HIGH (ref <=20.0)
Saccharomyces cerevisiae, IgA: 10.1 U (ref <=20.0)

## 2014-08-21 ENCOUNTER — Ambulatory Visit: Payer: BLUE CROSS/BLUE SHIELD | Admitting: Sports Medicine

## 2014-08-21 LAB — STOOL CULTURE

## 2016-02-15 IMAGING — CR DG CHEST 2V
2 series · 2 of 2 positions shown · non-contrast
Comparison: Chest CT 02/28/2014

CLINICAL DATA: Costochondritis.  Anterior chest pain.

EXAM:
CHEST  2 VIEW

[view not recorded (1 of 2)]
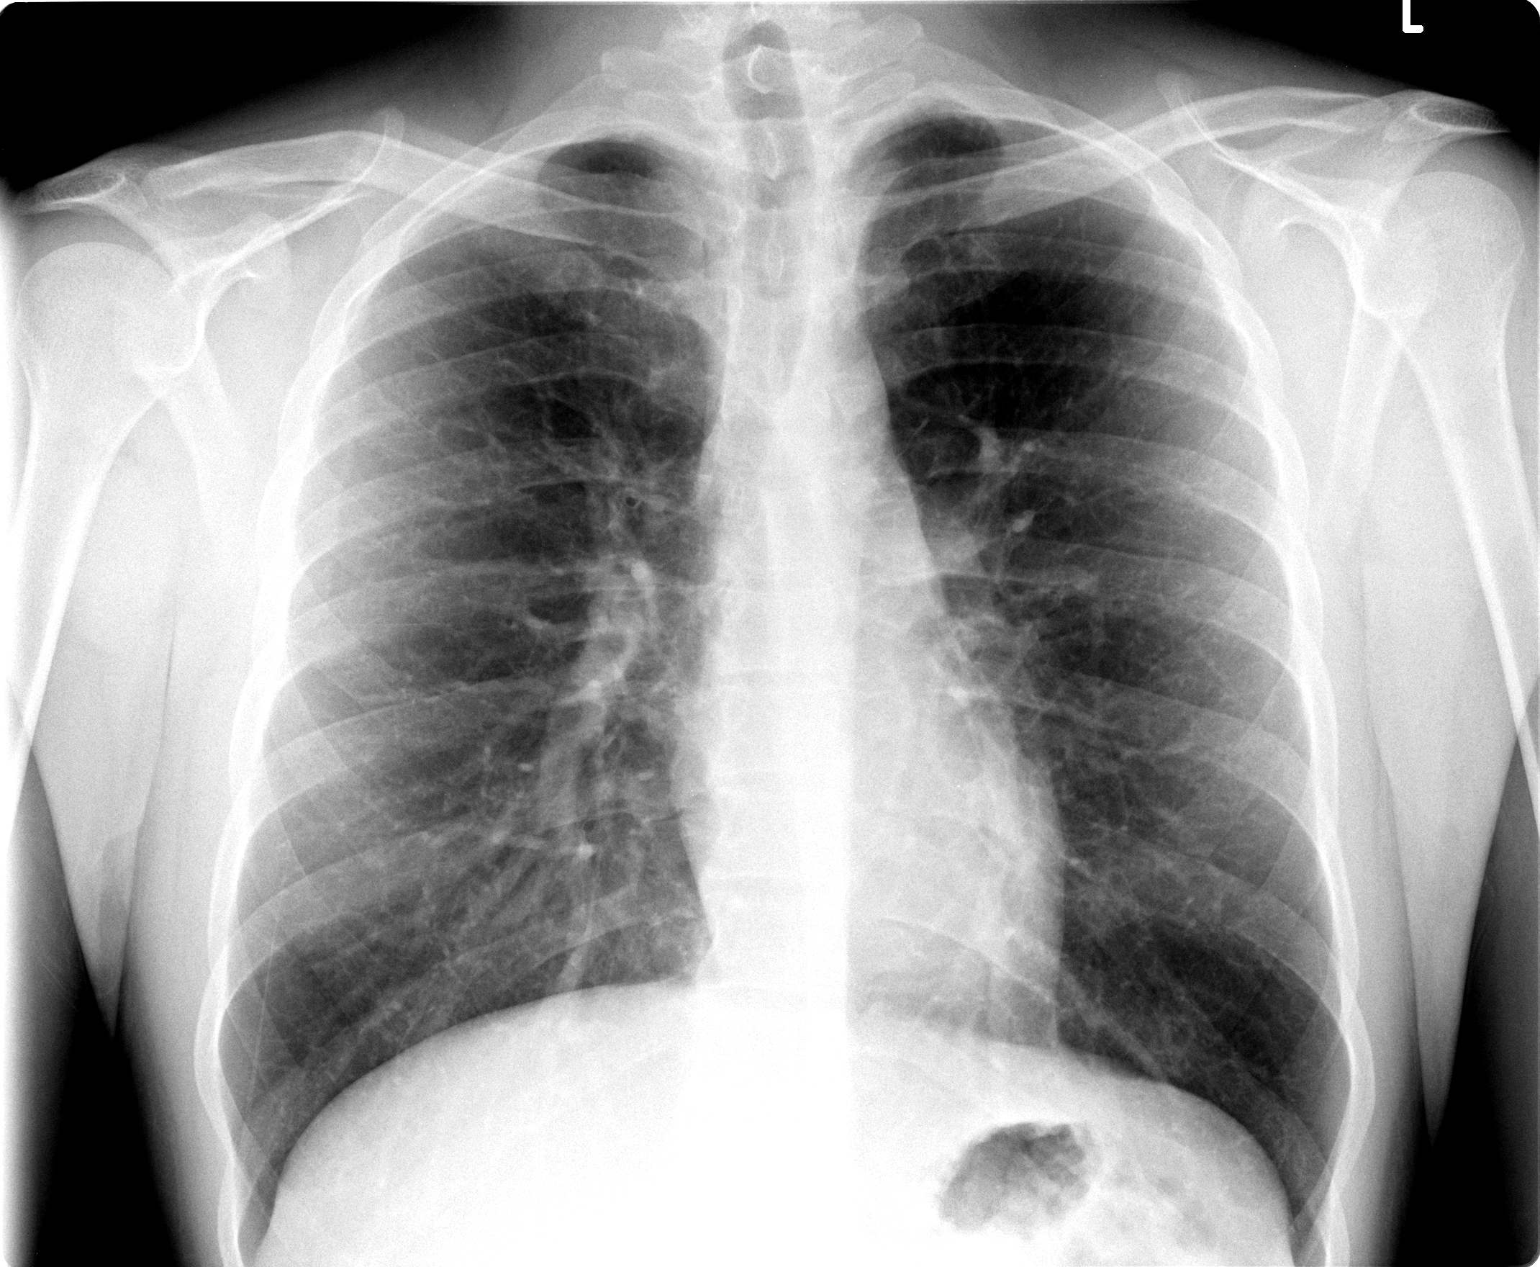

[view not recorded (2 of 2)]
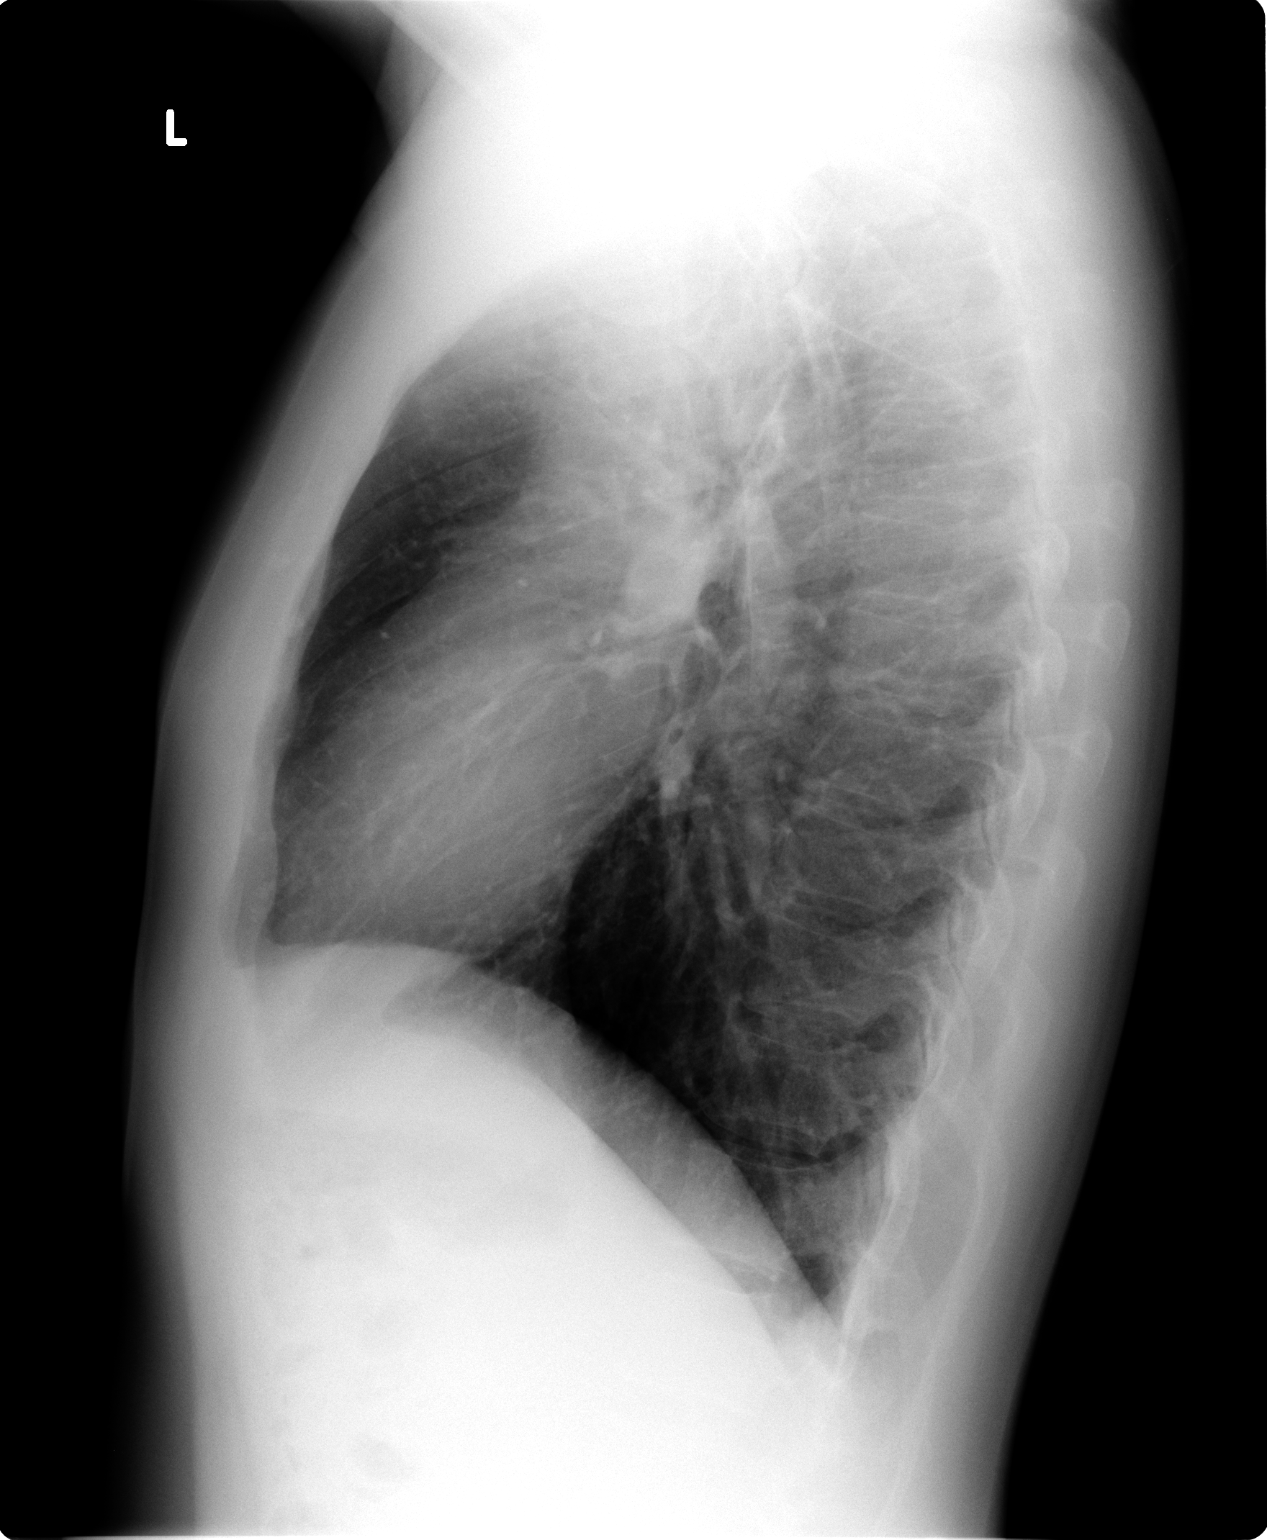

[2 of 2 positions shown; findings below may reference images not displayed]

FINDINGS: The heart size and mediastinal contours are within normal limits.
Both lungs are clear. The visualized skeletal structures are
unremarkable.
IMPRESSION: No active cardiopulmonary disease.
